# Patient Record
Sex: Female | Born: 1970 | Race: White | Hispanic: No
Health system: Southern US, Community
[De-identification: ages and names within clinical notes are randomized; demographics above are authoritative.]

---

## 2010-01-27 ENCOUNTER — Ambulatory Visit (HOSPITAL_COMMUNITY): Admission: RE | Admit: 2010-01-27 | Discharge: 2010-01-27 | Payer: Self-pay | Admitting: Emergency Medicine

## 2010-05-17 HISTORY — PX: OTHER SURGICAL HISTORY: SHX169

## 2010-09-17 ENCOUNTER — Emergency Department (HOSPITAL_COMMUNITY)
Admission: EM | Admit: 2010-09-17 | Discharge: 2010-09-17 | Disposition: A | Discharge status: Home / Self Care | Payer: Federal, State, Local not specified - PPO | Attending: Emergency Medicine | Admitting: Emergency Medicine

## 2010-09-17 ENCOUNTER — Emergency Department (HOSPITAL_COMMUNITY): Payer: Federal, State, Local not specified - PPO

## 2010-09-17 DIAGNOSIS — N2889 Other specified disorders of kidney and ureter: Secondary | ICD-10-CM | POA: Insufficient documentation

## 2010-09-17 DIAGNOSIS — Z79899 Other long term (current) drug therapy: Secondary | ICD-10-CM | POA: Insufficient documentation

## 2010-09-17 DIAGNOSIS — I079 Rheumatic tricuspid valve disease, unspecified: Secondary | ICD-10-CM | POA: Insufficient documentation

## 2010-09-17 DIAGNOSIS — R109 Unspecified abdominal pain: Secondary | ICD-10-CM | POA: Insufficient documentation

## 2010-09-17 LAB — DIFFERENTIAL
Basophils Absolute: 0 10*3/uL (ref 0.0–0.1)
Basophils Relative: 0 % (ref 0–1)
Lymphocytes Relative: 15 % (ref 12–46)
Monocytes Relative: 5 % (ref 3–12)
Neutro Abs: 7.5 10*3/uL (ref 1.7–7.7)
Neutrophils Relative %: 79 % — ABNORMAL HIGH (ref 43–77)

## 2010-09-17 LAB — URINALYSIS, ROUTINE W REFLEX MICROSCOPIC
Glucose, UA: NEGATIVE mg/dL
Leukocytes, UA: NEGATIVE
pH: 6 (ref 5.0–8.0)

## 2010-09-17 LAB — URINE MICROSCOPIC-ADD ON

## 2010-09-17 LAB — BASIC METABOLIC PANEL
Chloride: 101 mEq/L (ref 96–112)
Creatinine, Ser: 0.8 mg/dL (ref 0.4–1.2)
GFR calc Af Amer: >60 mL/min (ref >60)
Sodium: 136 mEq/L (ref 135–145)

## 2010-09-17 LAB — CBC
Hemoglobin: 13 g/dL (ref 12.0–15.0)
MCH: 29 pg (ref 26.0–34.0)
RBC: 4.49 MIL/uL (ref 3.87–5.11)

## 2010-09-19 ENCOUNTER — Inpatient Hospital Stay (HOSPITAL_COMMUNITY)
Admission: RE | Admit: 2010-09-19 | Discharge: 2010-09-22 | DRG: 331 | Disposition: A | Discharge status: Home / Self Care | Payer: Federal, State, Local not specified - PPO | Source: Other Acute Inpatient Hospital | Attending: Urology | Admitting: Urology

## 2010-09-19 DIAGNOSIS — D4959 Neoplasm of unspecified behavior of other genitourinary organ: Secondary | ICD-10-CM | POA: Diagnosis present

## 2010-09-19 DIAGNOSIS — N390 Urinary tract infection, site not specified: Secondary | ICD-10-CM | POA: Diagnosis not present

## 2010-09-19 DIAGNOSIS — F319 Bipolar disorder, unspecified: Secondary | ICD-10-CM | POA: Diagnosis present

## 2010-09-19 DIAGNOSIS — N2889 Other specified disorders of kidney and ureter: Principal | ICD-10-CM | POA: Diagnosis present

## 2010-09-19 DIAGNOSIS — R000 Tachycardia, unspecified: Secondary | ICD-10-CM | POA: Diagnosis present

## 2010-09-19 LAB — HEMOGLOBIN AND HEMATOCRIT, BLOOD
HCT: 28.6 % — ABNORMAL LOW (ref 36.0–46.0)
Hemoglobin: 9.1 g/dL — ABNORMAL LOW (ref 12.0–15.0)
Hemoglobin: 9.6 g/dL — ABNORMAL LOW (ref 12.0–15.0)

## 2010-09-19 LAB — MRSA PCR SCREENING: MRSA by PCR: POSITIVE — AB

## 2010-09-20 LAB — ABO/RH: ABO/RH(D): O NEG

## 2010-09-20 LAB — BASIC METABOLIC PANEL
BUN: <3 mg/dL — ABNORMAL LOW (ref 6–23)
Chloride: 102 mEq/L (ref 96–112)
Potassium: 3.6 mEq/L (ref 3.5–5.1)

## 2010-09-20 LAB — HEMOGLOBIN AND HEMATOCRIT, BLOOD
HCT: 27.4 % — ABNORMAL LOW (ref 36.0–46.0)
HCT: 28.4 % — ABNORMAL LOW (ref 36.0–46.0)
Hemoglobin: 9.4 g/dL — ABNORMAL LOW (ref 12.0–15.0)
Hemoglobin: 9.7 g/dL — ABNORMAL LOW (ref 12.0–15.0)
Hemoglobin: 9.8 g/dL — ABNORMAL LOW (ref 12.0–15.0)

## 2010-09-21 ENCOUNTER — Inpatient Hospital Stay (HOSPITAL_COMMUNITY): Payer: Federal, State, Local not specified - PPO

## 2010-09-21 LAB — URINALYSIS, ROUTINE W REFLEX MICROSCOPIC
Glucose, UA: NEGATIVE mg/dL
Protein, ur: NEGATIVE mg/dL
Specific Gravity, Urine: 1.006 (ref 1.005–1.030)
pH: 7 (ref 5.0–8.0)

## 2010-09-21 LAB — URINE MICROSCOPIC-ADD ON

## 2010-09-21 LAB — HEMOGLOBIN AND HEMATOCRIT, BLOOD: HCT: 27.9 % — ABNORMAL LOW (ref 36.0–46.0)

## 2010-09-22 LAB — HEMOGLOBIN AND HEMATOCRIT, BLOOD: Hemoglobin: 8.7 g/dL — ABNORMAL LOW (ref 12.0–15.0)

## 2010-09-23 LAB — CROSSMATCH: Unit division: 0

## 2010-09-23 LAB — URINE CULTURE
Culture  Setup Time: 201205081632
Special Requests: NEGATIVE

## 2010-09-23 NOTE — H&P (Signed)
[name], [name] NO.:  [id number]  MEDICAL RECORD NO.:  [id number]           PATIENT TYPE:  LOCATION:                                 FACILITY:  PHYSICIAN:  Heloise Purpura, MD      DATE OF BIRTH:  April 02, 1971  DATE OF ADMISSION:  09/18/2010 DATE OF DISCHARGE:                             HISTORY & PHYSICAL   CHIEF COMPLAINT:  Right renal mass with a right perinephric hematoma.  HISTORY:  Kellie Ellis is a 40 year old female who developed the acute onset of right-sided flank pain approximately 48 hours ago.  She had a CT scan without contrast performed on Sep 17, 2010 at [facility] for this reason, which revealed a large right-sided perinephric hematoma and a questionable right renal mass, which was poorly characterized due to the fact that she did not receive IV contrast.  She was found to be stable at that time and her hemoglobin was 13.0.  She was able to be discharged from the emergency department with oral pain medication. However, her pain was poorly controlled prompting further evaluation at [facility] in [place], [place].  She was admitted to the hospital there and a contrasted CT scan was performed, which revealed an enlarging right perinephric hematoma with a probable large upper pole right renal mass.  Her hemoglobin was noted to be decreasing from 13.0- 10.8 over a 24-hour period.  In addition, she was noted be tachycardic with evidence of acute blood loss.  She has denied any gross hematuria. She has no family history of kidney cancer, angiomyolipoma, or tuberous sclerosis.  PAST MEDICAL HISTORY: 1. History of bipolar disorder. 2. Depression. 3. Migraines. 4. HSV. 5. History of panic attacks.  PAST SURGICAL HISTORY:  None.  MEDICATIONS: 1. Phentermine 2. Ibuprofen p.r.n.  ALLERGIES:  No known drug allergies.  FAMILY HISTORY:  No history of kidney cancer, end-stage renal disease, angiomyolipoma, or tubular  sclerosis.  SOCIAL HISTORY:  She denies tobacco use.  She drinks alcohol occasionally.  She works as a Engineer, civil (consulting) at [employer].  ROS: A complete review of systems was performed.  All pertinent findings are as in the history of present illness.  All other systems are reviewed and are negative.  PHYSICAL EXAMINATION:  VITALS:  Temperature 98.8, heart rate 115, respirations 12, blood pressure 105/65. CONSTITUTIONAL:  The patient is somewhat drowsy, but otherwise alert and oriented.  She is in no acute distress. HEENT:  Normocephalic, atraumatic. NECK:  Supple without lymphadenopathy. CARDIOVASCULAR:  Her heart rhythm is regular.  She is tachycardic. LUNGS:  Clear bilaterally. ABDOMEN:  Mildly distended with moderate right-sided abdominal tenderness and right CVA tenderness. EXTREMITIES:  No edema. NEUROLOGIC:  Grossly intact.  LABORATORY DATA:  Her hemoglobin on Sep 17, 2010 was 13.0 and was found to be 10.8 at 6 p.m. on Sep 18, 2010.  Her serum creatinine is normal at 0.89.  RADIOLOGIC IMAGING:  I independently reviewed her CT scan with contrast from [facility] as well as her unenhanced CT scan from [facility].  Findings are as dictated above.  IMPRESSION:  She appears to have a large  right renal mass with a differential diagnosis including angiomyolipoma versus renal cell carcinoma with a large right perinephric hematoma.  PLAN:  She will be admitted to the step-down ICU for close hemodynamic monitoring and supportive care.  Her vital signs will be monitored and she will have serial hemoglobins checked while remaining on bedrest for the time being.  If she does have ongoing bleeding, she will need a blood transfusion, will be typed and crossed for 2 units of blood.  If further ongoing bleeding occurs, she may need selective renal artery embolization per Interventional Radiology.  Following resolution of her acute bleed, she will require further definitive imaging in the  future to help determine whether she has a right angiomyolipoma versus renal cell carcinoma and I will proceed with further evaluation including chest imaging to exclude the presence of any metastatic disease. Regardless, she likely will require a right-sided nephrectomy in the future for ultimate definitive diagnosis.     Heloise Purpura, MD     LB/MEDQ  D:  09/19/2010  T:  09/19/2010  Job:  [id number]  Electronically Signed by Heloise Purpura MD on 09/23/2010 06:32:51 AM

## 2010-09-23 NOTE — Discharge Summary (Signed)
Kellie Ellis, Kellie Ellis NO.:  [id number]  MEDICAL RECORD NO.:  [id number]           PATIENT TYPE:  I  LOCATION:  1423                         FACILITY:  [facility]  PHYSICIAN:  Heloise Purpura, MD      DATE OF BIRTH:  [birth year]  DATE OF ADMISSION:  09/19/2010 DATE OF DISCHARGE:  09/22/2010                              DISCHARGE SUMMARY   DATE OF ADMISSION:  Sep 19, 2010  DATE OF DISCHARGE:  Sep 22, 2010  ADMISSION DIAGNOSES: 1. Perinephric hematoma. 2. Right renal neoplasm.  DISCHARGE DIAGNOSES: 1. Perinephric hematoma. 2. Right renal neoplasm. 3. Urinary tract infection.  HISTORY AND PHYSICAL:  For full details please see admission history and physical.  Briefly, Kellie Ellis is a 40 year old female who developed acute onset of right-sided flank pain and underwent CT imaging at an outside hospital, which demonstrated a large right perinephric hematoma and a presumed right renal neoplasm.  She was discharged home with pain medication, subsequently developed worsening pain symptoms requiring a repeat admission to an outside hospital at which time she underwent a contrasted CT, which confirmed the aforementioned findings on unenhanced CT scanning.  She was also noted to have an acute drop in her hemoglobin and it was therefore requested that she be transferred to [facility] for further urologic care.  HOSPITAL COURSE:  On Sep 19, 2010, Kellie Ellis was transferred to the step- down intensive care unit at [facility] from [facility].  She was relatively stable at that time aside from tachycardia and mild hypotension.  Her hemoglobin had dropped acutely from 13 to just above 10 and she was hemodynamically monitored and typed and crossed for 2 units of blood in preparation for possible transfusion.  She remained relatively hemodynamically stable over the next 24 hours and her serial hemoglobins stabilized around 9.5.  Within 24 hours, she was  felt to be stable for transfer out of the step-down intensive care unit to a regular hospital bed where she continued to receive serial hemoglobin measurements.  Again, her hemoglobin remained stable indicating no evidence of ongoing bleeding and no indication for transfusion or selective arterial embolization of the right kidney. Her renal function also was noted to remain stable.  She did have a Foley catheter placed during her admission for close hemodynamic monitoring purposes.  On hospital day #3, her Foley catheter was removed and she began ambulating, which she did without difficulty.  She again remained hemodynamically stable except for mild tachycardia and she was transitioned to oral pain medication, which controlled her pain except for rare instances where she did require IV pain medication. That evening she had complained of some dysuria.  Urinalysis was checked, which did reveal concern for a urinary tract infection.  A urine culture was sent and she was empirically started on Bactrim.  By hospital day #4, she was reevaluated and her pain was better controlled.  Her hemoglobin did drop to 8.7, which raised concern about renewed bleeding. However, repeat hemoglobin later that morning was 9.0 and consistent with it being stable.  She was therefore felt to be stable for discharge  home that afternoon.  DISPOSITION:  Home.  DISCHARGE MEDICATIONS:  She was instructed to resume her phentermine, but told to refrain from any nonsteroidal anti-inflammatory use or aspirin products.  She was given a prescription to take Percocet as needed for pain and also given a 3-day prescription for Bactrim to treat her urinary tract infection.  DISCHARGE INSTRUCTIONS:  She was instructed to be ambulatory, but specifically told to refrain from any heavy lifting, strenuous activity or exercise.  We discussed returning to work within about a week, assuming she did not have to engage in any  strenuous physical activity at that time.  FOLLOWUP:  She will follow up in 1 month for further evaluation.  The current plan is to proceed with repeat imaging in a few months to assess resolution of her perinephric hematoma and further evaluation of what appears to be a large right renal mass, possibly indicating an angiomyolipoma versus renal cell carcinoma.  Of note, she did undergo chest imaging during her hospitalization, which did not reveal any evidence of pulmonary metastasis.     Heloise Purpura, MD     LB/MEDQ  D:  09/22/2010  T:  09/22/2010  Job:  [id number]  Electronically Signed by Heloise Purpura MD on 09/23/2010 06:33:14 AM

## 2010-12-03 ENCOUNTER — Other Ambulatory Visit (HOSPITAL_COMMUNITY): Payer: Self-pay | Admitting: Urology

## 2010-12-03 ENCOUNTER — Ambulatory Visit (HOSPITAL_COMMUNITY)
Admission: RE | Admit: 2010-12-03 | Discharge: 2010-12-03 | Disposition: A | Discharge status: Home / Self Care | Payer: Federal, State, Local not specified - PPO | Source: Ambulatory Visit | Attending: Urology | Admitting: Urology

## 2010-12-03 DIAGNOSIS — Z01818 Encounter for other preprocedural examination: Secondary | ICD-10-CM | POA: Insufficient documentation

## 2010-12-03 DIAGNOSIS — N289 Disorder of kidney and ureter, unspecified: Secondary | ICD-10-CM | POA: Insufficient documentation

## 2010-12-03 DIAGNOSIS — N2889 Other specified disorders of kidney and ureter: Secondary | ICD-10-CM

## 2010-12-08 ENCOUNTER — Other Ambulatory Visit: Payer: Self-pay | Admitting: Urology

## 2010-12-08 ENCOUNTER — Encounter (HOSPITAL_COMMUNITY): Payer: Federal, State, Local not specified - PPO

## 2010-12-08 LAB — HCG, SERUM, QUALITATIVE: Preg, Serum: NEGATIVE

## 2010-12-08 LAB — BASIC METABOLIC PANEL
Calcium: 10 mg/dL (ref 8.4–10.5)
Creatinine, Ser: 0.77 mg/dL (ref 0.50–1.10)
GFR calc Af Amer: >60 mL/min (ref >60)

## 2010-12-08 LAB — CBC
MCH: 26.2 pg (ref 26.0–34.0)
MCHC: 31.7 g/dL (ref 30.0–36.0)
Platelets: 288 10*3/uL (ref 150–400)

## 2010-12-08 LAB — SURGICAL PCR SCREEN: MRSA, PCR: NEGATIVE

## 2010-12-17 ENCOUNTER — Inpatient Hospital Stay (HOSPITAL_COMMUNITY)
Admission: RE | Admit: 2010-12-17 | Discharge: 2010-12-19 | DRG: 303 | Disposition: A | Discharge status: Home / Self Care | Payer: Federal, State, Local not specified - PPO | Source: Ambulatory Visit | Attending: Urology | Admitting: Urology

## 2010-12-17 ENCOUNTER — Other Ambulatory Visit: Payer: Self-pay | Admitting: Urology

## 2010-12-17 DIAGNOSIS — G43909 Migraine, unspecified, not intractable, without status migrainosus: Secondary | ICD-10-CM | POA: Diagnosis present

## 2010-12-17 DIAGNOSIS — F319 Bipolar disorder, unspecified: Secondary | ICD-10-CM | POA: Diagnosis present

## 2010-12-17 DIAGNOSIS — N3020 Other chronic cystitis without hematuria: Secondary | ICD-10-CM | POA: Diagnosis present

## 2010-12-17 DIAGNOSIS — Z01812 Encounter for preprocedural laboratory examination: Secondary | ICD-10-CM

## 2010-12-17 DIAGNOSIS — D4959 Neoplasm of unspecified behavior of other genitourinary organ: Principal | ICD-10-CM | POA: Diagnosis present

## 2010-12-17 DIAGNOSIS — Z79899 Other long term (current) drug therapy: Secondary | ICD-10-CM

## 2010-12-17 DIAGNOSIS — B009 Herpesviral infection, unspecified: Secondary | ICD-10-CM | POA: Diagnosis present

## 2010-12-17 DIAGNOSIS — F341 Dysthymic disorder: Secondary | ICD-10-CM | POA: Diagnosis present

## 2010-12-17 LAB — HEMOGLOBIN AND HEMATOCRIT, BLOOD: Hemoglobin: 12.6 g/dL (ref 12.0–15.0)

## 2010-12-17 LAB — BASIC METABOLIC PANEL
BUN: 6 mg/dL (ref 6–23)
CO2: 27 mEq/L (ref 19–32)
Chloride: 104 mEq/L (ref 96–112)
Creatinine, Ser: 0.89 mg/dL (ref 0.50–1.10)

## 2010-12-18 LAB — BASIC METABOLIC PANEL
CO2: 24 mEq/L (ref 19–32)
Calcium: 8.4 mg/dL (ref 8.4–10.5)
Chloride: 104 mEq/L (ref 96–112)
GFR calc Af Amer: >60 mL/min (ref >60)
Sodium: 137 mEq/L (ref 135–145)

## 2010-12-18 LAB — HEMOGLOBIN AND HEMATOCRIT, BLOOD: HCT: 36.2 % (ref 36.0–46.0)

## 2010-12-19 LAB — BASIC METABOLIC PANEL
GFR calc Af Amer: >60 mL/min (ref >60)
GFR calc non Af Amer: 56 mL/min — ABNORMAL LOW (ref >60)
Glucose, Bld: 95 mg/dL (ref 70–99)
Potassium: 3.4 mEq/L — ABNORMAL LOW (ref 3.5–5.1)
Sodium: 136 mEq/L (ref 135–145)

## 2011-01-04 NOTE — Discharge Summary (Signed)
  NAMEHAYDEE, Ellis NO.:  [id number]  MEDICAL RECORD NO.:  [id number]  LOCATION:  1439                         FACILITY:  [facility]  PHYSICIAN:  Heloise Purpura, MD      DATE OF BIRTH:  1970-12-16  DATE OF ADMISSION:  12/17/2010 DATE OF DISCHARGE:  12/19/2010                              DISCHARGE SUMMARY   ADMISSION DIAGNOSES: 1. Right renal neoplasm. 2. History of right perinephric hematoma.  DISCHARGE DIAGNOSES: 1. Right renal neoplasm. 2. History of right perinephric hematoma.  HISTORY AND PHYSICAL:  For full details, please see admission history and physical.  Briefly, [name] is a 40 year old female who initially presented a few months ago with a spontaneous right perinephric hematoma, requiring hospitalization.  She was felt to have a right renal neoplasm and underwent a repeat imaging approximately 3 months after her spontaneous bleed and did have a persistent mass on the right kidney concerning for possible renal malignancy.  She underwent metastatic evaluation, which was negative and after reviewing options for treatment, she elected to proceed with a right laparoscopic radical nephrectomy.  HOSPITAL COURSE:  On December 17, 2010, she was taken to the operating room and underwent a right laparoscopic radical nephrectomy.  She tolerated this procedure well without complications.  Postoperatively, she was able to be transferred to a regular hospital room following recovery from anesthesia.  She remained hemodynamically stable and was able to begin ambulating, which she did without difficulty.  On the morning of postoperative day #1, she remained hemodynamically stable with a hemoglobin of 11.8.  Her renal function also remained stable throughout her hospitalization with a discharge serum creatinine of 1.08.  On postoperative day #1, her diet was gradually advanced until she was tolerating a regular diet on the evening of postoperative day #1.   She was transitioned to oral pain medication and was felt to be stable for discharge home on postoperative day #2.  DISPOSITION:  Home.  DISCHARGE MEDICATIONS:  She will resume her regular home medications and has been provided a prescription to take Percocet as needed for pain.  DISCHARGE INSTRUCTIONS:  She has been instructed be ambulatory, but specifically told to refrain from any heavy lifting, strenuous activity, or driving.  PATHOLOGY:  Her pathology is pending at the time of discharge.  FOLLOWUP:  She will follow up in approximately 3-4 weeks for further postoperative evaluation and to check a renal function as well as to further discuss her final surgical pathology.     Heloise Purpura, MD     LB/MEDQ  D:  12/19/2010  T:  12/20/2010  Job:  [id number]  Electronically Signed by Heloise Purpura MD on 01/04/2011 06:37:28 PM

## 2011-01-04 NOTE — Op Note (Signed)
Kellie Ellis, Kellie Ellis NO.:  [id number]  MEDICAL RECORD NO.:  [id number]  LOCATION:  1439                         FACILITY:  [facility]  PHYSICIAN:  Heloise Purpura, MD      DATE OF BIRTH:  09/14/1970  DATE OF PROCEDURE:  12/17/2010 DATE OF DISCHARGE:                              OPERATIVE REPORT   PREOPERATIVE DIAGNOSES: 1. Right renal neoplasm. 2. History of right perinephric hematoma.  POSTOPERATIVE DIAGNOSES: 1. Right renal neoplasm. 2. History of right perinephric hematoma.  PROCEDURE:  Right laparoscopic radical nephrectomy.  SURGEON:  Heloise Purpura, M.D.  ASSISTANT:  Delia Chimes, NP-C  ANESTHESIA:  General.  COMPLICATIONS:  None.  ESTIMATED BLOOD LOSS:  100 cc.  INTRAVENOUS FLUIDS:  2 L of crystalloid.  SPECIMEN:  Right kidney.  DISPOSITION OF SPECIMEN:  To pathology.  INDICATIONS:  Ms. Monte is a 40 year old female who initially presented after developing a spontaneous large right perinephric hematoma, which required hospitalization.  Fortunately, she did not require transfusion. She was monitored conservatively and her spontaneous bleed stopped without requiring intervention.  She was thought to have a mass on her right kidney and she underwent a repeat CT scan approximately 3 months after her bleed, which confirmed that her hematoma for the most part resolved, but she did have a persistent mass in the right kidney with areas of enhancement concerning for possible malignancy.  She underwent metastatic evaluation, which was negative and after discussing management options for treatment, she elected to proceed with surgical therapy and the above procedure.  The potential risks, complications, and alternative treatment options were discussed in detail and informed consent obtained.  DESCRIPTION OF PROCEDURE:  The patient was taken to the operating room and a general anesthetic was administered.  She was given preoperative antibiotics,  placed in the right modified flank position, and prepped and draped in the usual sterile fashion.  Next, a preoperative time-out was performed.  A site was selected just inferior to the umbilicus for placement of the camera port, which was placed using a standard open Hassan technique.  This allowed entry into the peritoneal cavity under direct vision and without difficulty.  0 Vicryl fascial holding sutures were placed and used to secure the [facility] 12 mm port.  A 30-degree lens was then used to inspect the abdomen and there was no evidence of any intraabdominal injuries or other abnormalities.  The remaining ports were then placed.  A 5 mm port was placed in the right upper quadrant and a 12 mm port was placed in the right lower quadrant.  These ports were placed under direct vision without difficulty.  At this point, the white line of Toldt was incised along the length of the ascending colon allowing the colon to be mobilized medially.  There was noted to be some adhesions between the bowel and Gerota fascia, which required careful dissection.  Once the colon was mobilized medially and the space between the mesocolon and the anterior layer of Gerota fascia developed, the duodenum was identified and this was also mobilized medially.  Due to the desmoplastic reaction related to her prior hematoma, her inferior vena cava was not initially identified.  With  further dissection, the psoas muscle was able to be identified lateral to the inferior vena cava and the gonadal vein and ureter were able to be lifted anteriorly off the psoas muscle.  Dissection then proceeded superiorly and carefully, the tissue below the level of the renal hilum was isolated and Hem-o-Lok clips were used as necessary for vascular control.  The gonadal vein was able to be isolated and divided between multiple Hem-o-Lok clips.  The dissection proceeded superiorly, a branching renal artery was eventually identified and  able to be isolated with right-angle dissection just below its branching point.  A single renal vein was noted just anterior to the renal artery and was also able to be isolated.  The renal artery was then divided after ligation with multiple 10-mm Hem-o-Lok clips. The renal vein once isolated was stapled and divided with a 45 mm Flex ETS stapler.  Gerota fascia was intentionally entered superiorly and the adrenal gland was spared.  The hepatorenal ligament was taken down with the harmonic scalpel as were the superior fatty attachments of the kidney.  Attention then turned inferiorly where the gonadal vein and ureter were isolated and divided after ligation with Hem-o-Lok clips. The lateral attachments of the kidney were then taken down with the harmonic scalpel allowing the specimen to be completely freed.  The renal fossa was then copiously irrigated.  Hemostasis was achieved utilizing clips or the harmonic scalpel for small venous oozing sites along the lateral wall of the abdomen.  The renal hilum and adrenal bed were carefully inspected with no evidence for ongoing bleeding.  The 12 mm port site in the right lower quadrant was then examined and a 0 Vicryl holding suture was placed laparoscopically for later fascial closure.  The kidney specimen was then placed into a 12/15 mm Inzii retrieval bag.  The ports were then removed under direct vision and the right lower quadrant port was closed at the fascial level with the previously placed 0 Vicryl suture.  The camera port incision was then extended in the periumbilical fashion and the kidney specimen was removed intact within the retrieval bag.  This fascial opening was then closed with 2 running #1 PDS sutures.  The incision sites were then injected in both the subcutaneous areas and subfascial layers with Exparel.  Following this, incisions were then closed with running 4-0 Monocryl subcuticular closures and Dermabond was applied to  skin.  She tolerated the procedure well and without complications.  She was able to be extubated and transferred to recovery unit in satisfactory condition.     Heloise Purpura, MD     LB/MEDQ  D:  12/17/2010  T:  12/17/2010  Job:  [id number]  Electronically Signed by Heloise Purpura MD on 01/04/2011 06:37:36 PM

## 2013-05-24 ENCOUNTER — Other Ambulatory Visit (HOSPITAL_COMMUNITY): Payer: Self-pay | Admitting: Internal Medicine

## 2013-05-24 DIAGNOSIS — R109 Unspecified abdominal pain: Secondary | ICD-10-CM

## 2013-05-25 ENCOUNTER — Ambulatory Visit (HOSPITAL_COMMUNITY)
Admission: RE | Admit: 2013-05-25 | Discharge: 2013-05-25 | Disposition: A | Discharge status: Home / Self Care | Payer: 59 | Source: Ambulatory Visit | Attending: Internal Medicine | Admitting: Internal Medicine

## 2013-05-25 ENCOUNTER — Encounter (HOSPITAL_COMMUNITY): Payer: Self-pay

## 2013-05-25 DIAGNOSIS — N83209 Unspecified ovarian cyst, unspecified side: Secondary | ICD-10-CM | POA: Insufficient documentation

## 2013-05-25 DIAGNOSIS — R1031 Right lower quadrant pain: Secondary | ICD-10-CM | POA: Insufficient documentation

## 2013-05-25 DIAGNOSIS — R109 Unspecified abdominal pain: Secondary | ICD-10-CM | POA: Insufficient documentation

## 2013-05-25 MED ORDER — IOHEXOL 300 MG/ML  SOLN
80.0000 mL | Freq: Once | INTRAMUSCULAR | Status: AC | PRN
  Administered 2013-05-25: 80 mL via INTRAVENOUS

## 2013-05-28 ENCOUNTER — Ambulatory Visit (HOSPITAL_COMMUNITY): Payer: Federal, State, Local not specified - PPO

## 2015-05-04 ENCOUNTER — Telehealth: Payer: 59 | Admitting: Adult Health

## 2015-05-04 DIAGNOSIS — N3000 Acute cystitis without hematuria: Secondary | ICD-10-CM | POA: Diagnosis not present

## 2015-05-04 MED ORDER — CIPROFLOXACIN HCL 500 MG PO TABS: 500.0000 mg | ORAL_TABLET | Freq: Two times a day (BID) | ORAL | Status: AC

## 2015-05-04 NOTE — Progress Notes (Signed)

## 2015-06-26 DIAGNOSIS — E559 Vitamin D deficiency, unspecified: Secondary | ICD-10-CM | POA: Diagnosis not present

## 2015-06-26 DIAGNOSIS — Z13220 Encounter for screening for lipoid disorders: Secondary | ICD-10-CM | POA: Diagnosis not present

## 2015-06-26 DIAGNOSIS — Z0001 Encounter for general adult medical examination with abnormal findings: Secondary | ICD-10-CM | POA: Diagnosis not present

## 2015-06-30 DIAGNOSIS — Z0000 Encounter for general adult medical examination without abnormal findings: Secondary | ICD-10-CM | POA: Diagnosis not present

## 2015-07-31 IMAGING — CT CT ABD-PELV W/ CM
2 of 5 series · 15 of 46 positions shown, 17 images · IV contrast (omnipaque)
Comparison: 12/04/2010

CLINICAL DATA: Right lower quadrant pain, status post right
nephrectomy, left flank pain

EXAM:
CT ABDOMEN AND PELVIS WITH CONTRAST
TECHNIQUE: Multidetector CT imaging of the abdomen and pelvis was performed
using the standard protocol following bolus administration of
intravenous contrast.
CONTRAST:  80mL OMNIPAQUE IOHEXOL 300 MG/ML  SOLN

[Series 2: abd_pel_with 5.0 b40f · axial · 0.68mm/px · z∈[-458,-54]mm · 12 of 91 slices shown, 14 images]
[im 5/91  soft-tissue]
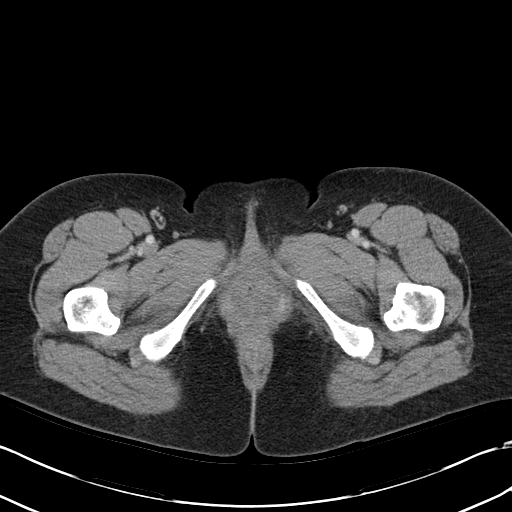
[im 5/91  bone]
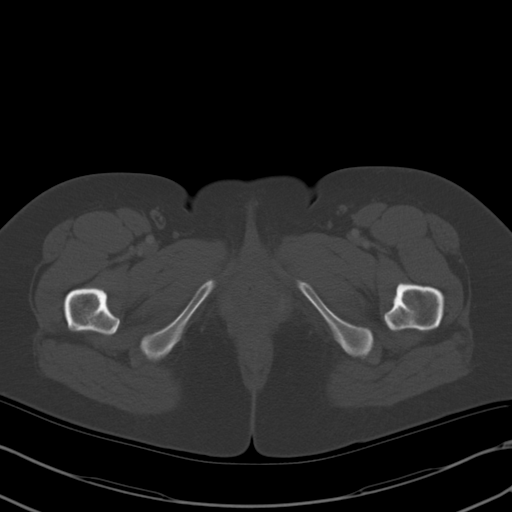
[im 15/91  soft-tissue]
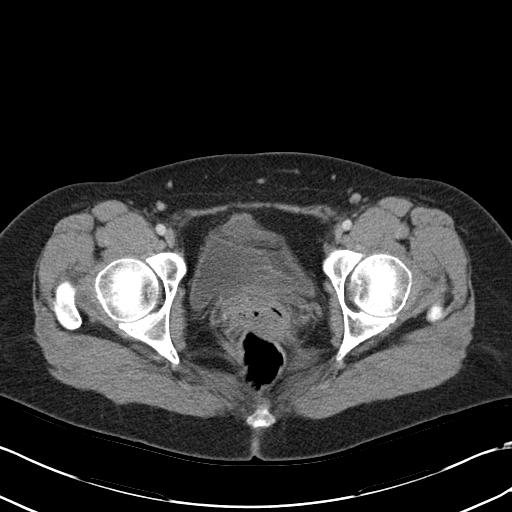
[im 19/91  soft-tissue]
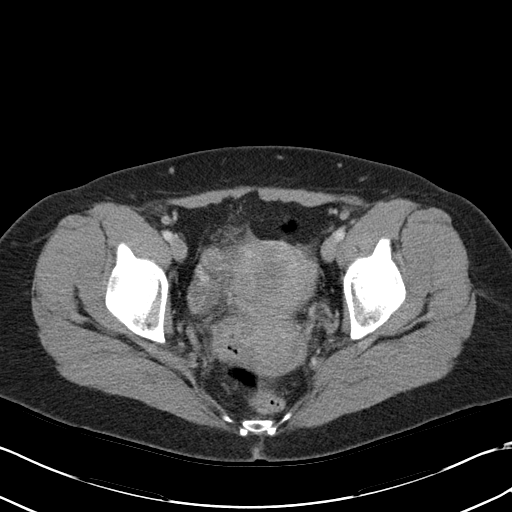
[im 29/91  soft-tissue]
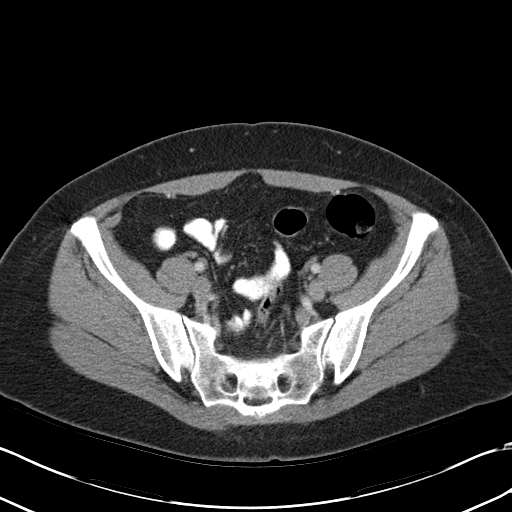
[im 34/91  soft-tissue]
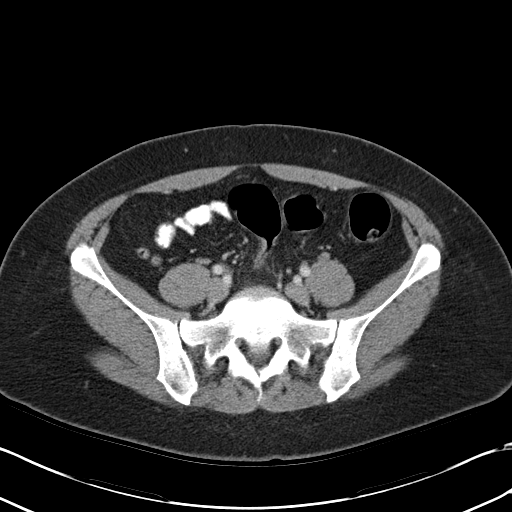
[im 43/91  soft-tissue]
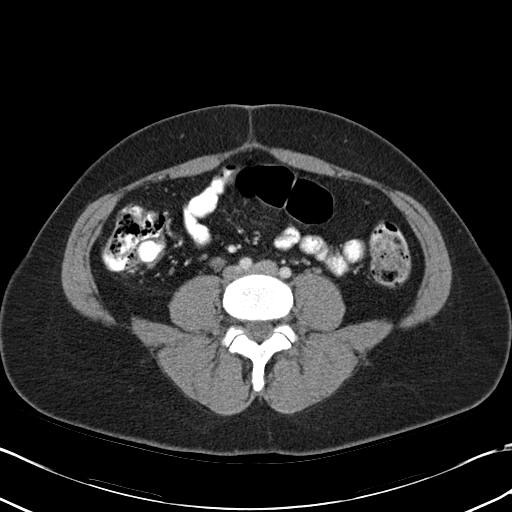
[im 48/91  soft-tissue]
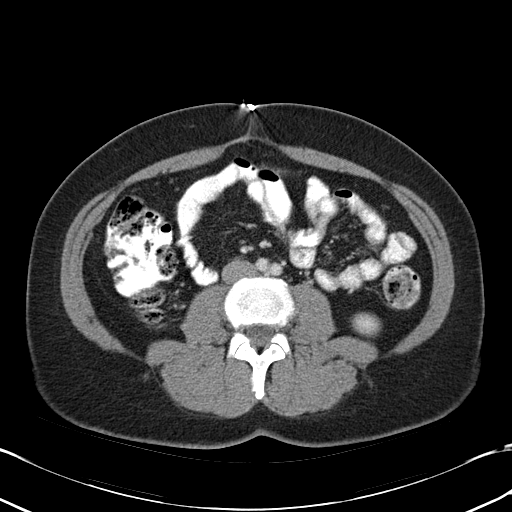
[im 57/91  soft-tissue]
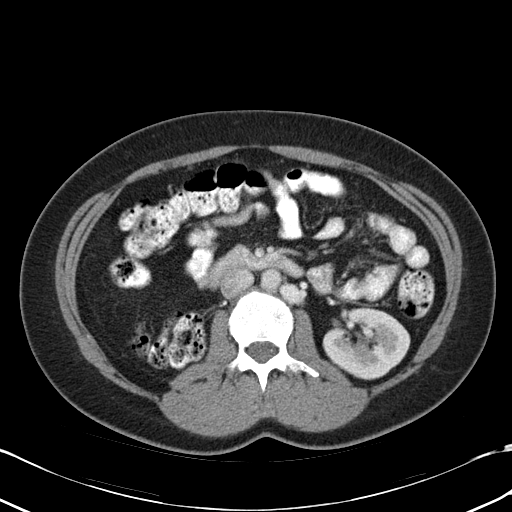
[im 62/91  soft-tissue]
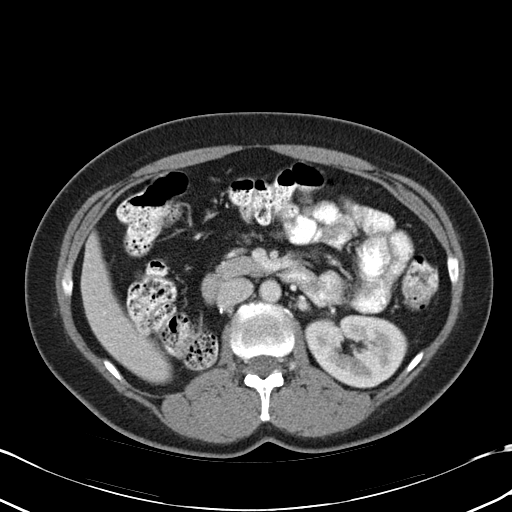
[im 62/91  bone]
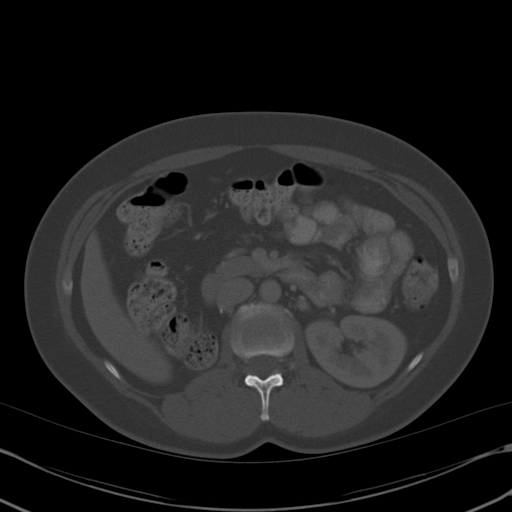
[im 72/91  soft-tissue]
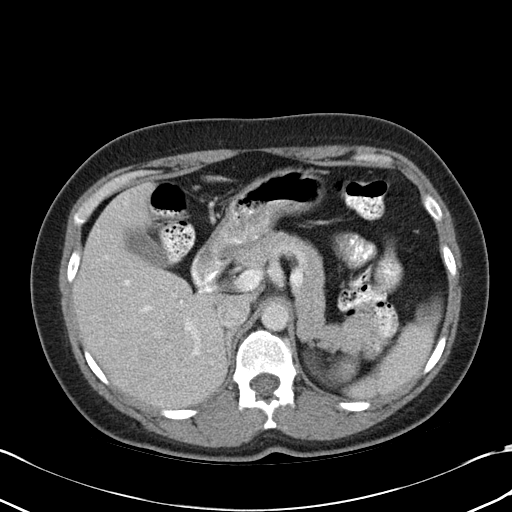
[im 76/91  soft-tissue]
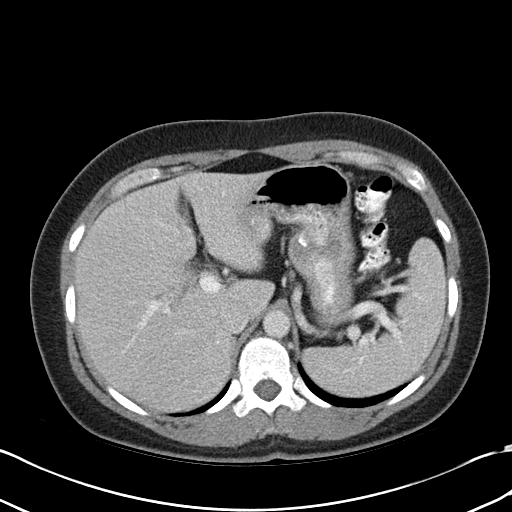
[im 86/91  soft-tissue]
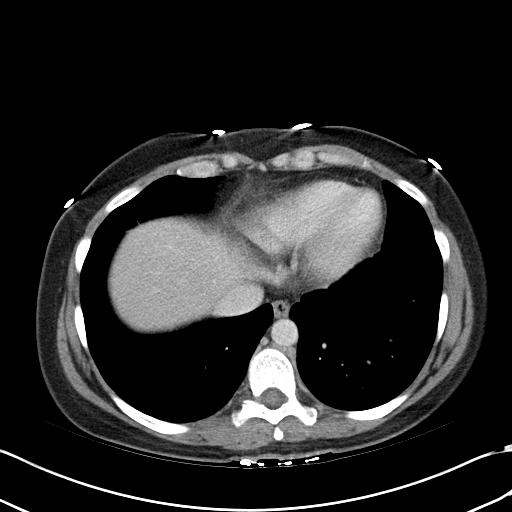

[Series 4: abd_pel_with 3.0 spo cor · coronal · 0.60mm/px · 3 of 75 slices shown]
[im 25/75  soft-tissue]
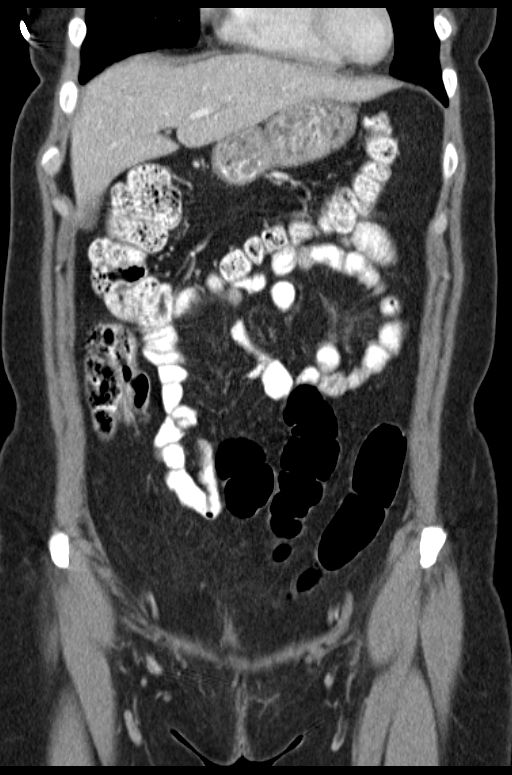
[im 33/75  soft-tissue]
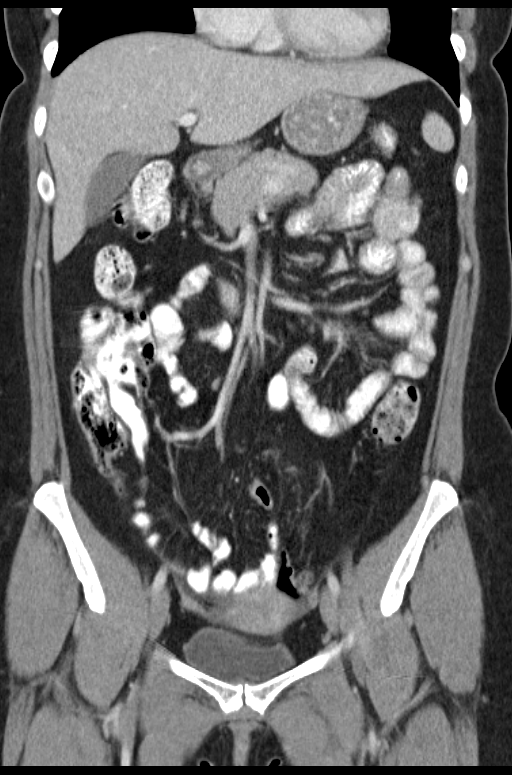
[im 42/75  soft-tissue]
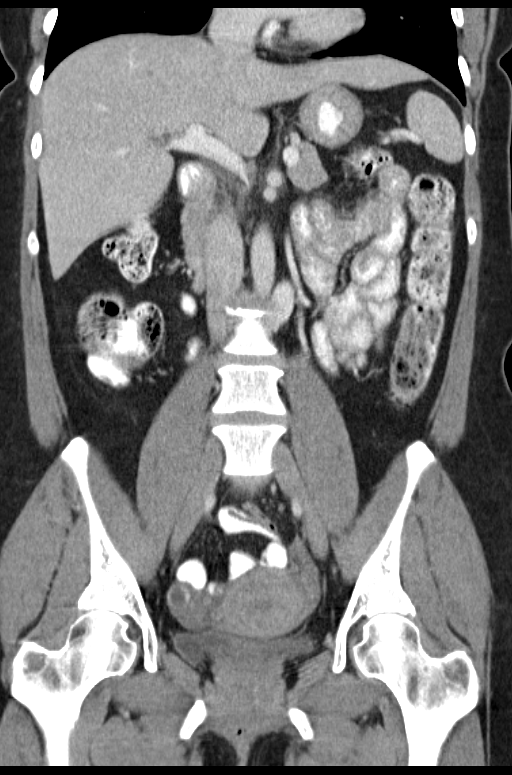

[15 of 46 positions shown; findings below may reference images not displayed]

FINDINGS: Lung bases are unremarkable. Liver, spleen, pancreas and left
adrenal are unremarkable. The right kidney surgically absent. The
left kidneys shows normal enhancement. No hydronephrosis or
hydroureter. Delay renal images shows normal excretion of the left
kidney. Visualized proximal left ureter is unremarkable. No
calcified gallstones are noted within gallbladder.

Moderate stool throughout the colon. No pericecal inflammation.
Normal appendix. The terminal ileum is unremarkable.

No small bowel obstruction. No ascites scattered no abdominal
ascites or free air. No adenopathy.

The uterus is unremarkable. There is a hemorrhagic follicle within
right ovary measures 2.1 x 1.6 cm. Small amount of pelvic free fluid
noted posterior cul-de-sac. The left ovary is unremarkable.

Sagittal images of the spine shows no destructive bony lesions.
IMPRESSION: 1. Surgically absent right kidney. No left hydronephrosis or
hydroureter. Normal excretion of the left kidney.
2. Normal appendix.  No pericecal inflammation.
3. Small amount pelvic free fluid within posterior cul-de-sac.
Hemorrhagic follicle within right ovary measures 2.1 x 1.6 cm.
4. Moderate stool throughout the colon.
